# Patient Record
Sex: Female | Born: 1970 | Race: Asian | Hispanic: No | Marital: Married | Smoking: Never smoker
Health system: Southern US, Community
[De-identification: ages and names within clinical notes are randomized; demographics above are authoritative.]

## PROBLEM LIST (undated history)

## (undated) DIAGNOSIS — F32A Depression, unspecified: Secondary | ICD-10-CM

## (undated) DIAGNOSIS — I10 Essential (primary) hypertension: Secondary | ICD-10-CM

## (undated) DIAGNOSIS — F329 Major depressive disorder, single episode, unspecified: Secondary | ICD-10-CM

---

## 2018-05-24 ENCOUNTER — Emergency Department (HOSPITAL_BASED_OUTPATIENT_CLINIC_OR_DEPARTMENT_OTHER)
Admission: EM | Admit: 2018-05-24 | Discharge: 2018-05-24 | Disposition: A | Discharge status: Home / Self Care | Payer: BLUE CROSS/BLUE SHIELD | Attending: Emergency Medicine | Admitting: Emergency Medicine

## 2018-05-24 ENCOUNTER — Emergency Department (HOSPITAL_BASED_OUTPATIENT_CLINIC_OR_DEPARTMENT_OTHER): Payer: BLUE CROSS/BLUE SHIELD

## 2018-05-24 ENCOUNTER — Encounter (HOSPITAL_BASED_OUTPATIENT_CLINIC_OR_DEPARTMENT_OTHER): Payer: Self-pay | Admitting: Emergency Medicine

## 2018-05-24 ENCOUNTER — Other Ambulatory Visit: Payer: Self-pay

## 2018-05-24 DIAGNOSIS — J069 Acute upper respiratory infection, unspecified: Secondary | ICD-10-CM | POA: Insufficient documentation

## 2018-05-24 DIAGNOSIS — Z79899 Other long term (current) drug therapy: Secondary | ICD-10-CM | POA: Diagnosis not present

## 2018-05-24 DIAGNOSIS — R05 Cough: Secondary | ICD-10-CM | POA: Diagnosis present

## 2018-05-24 DIAGNOSIS — I10 Essential (primary) hypertension: Secondary | ICD-10-CM | POA: Insufficient documentation

## 2018-05-24 DIAGNOSIS — B9789 Other viral agents as the cause of diseases classified elsewhere: Secondary | ICD-10-CM

## 2018-05-24 HISTORY — DX: Essential (primary) hypertension: I10

## 2018-05-24 HISTORY — DX: Depression, unspecified: F32.A

## 2018-05-24 HISTORY — DX: Major depressive disorder, single episode, unspecified: F32.9

## 2018-05-24 MED ORDER — LIDOCAINE VISCOUS HCL 2 % MT SOLN: 15.0000 mL | OROMUCOSAL | 0 refills | Status: DC | PRN

## 2018-05-24 MED ORDER — LORATADINE 10 MG PO TABS: 10.0000 mg | ORAL_TABLET | Freq: Every day | ORAL | 0 refills | Status: AC

## 2018-05-24 MED ORDER — GUAIFENESIN-CODEINE 100-10 MG/5ML PO SOLN: 5.0000 mL | Freq: Three times a day (TID) | ORAL | 0 refills | Status: DC | PRN

## 2018-05-24 MED ORDER — BENZONATATE 100 MG PO CAPS: 200.0000 mg | ORAL_CAPSULE | Freq: Three times a day (TID) | ORAL | 0 refills | Status: AC

## 2018-05-24 NOTE — ED Notes (Signed)
Patient transported to X-ray 

## 2018-05-24 NOTE — ED Provider Notes (Addendum)
[facility] EMERGENCY DEPARTMENT Provider Note   CSN: [id number] Arrival date & time: 05/24/18  4098     History   Chief Complaint Chief Complaint  Patient presents with  . Cough    HPI Archer Vise is a 47 y.o. female with a past medical history of hypertension, who presents to ED for evaluation of 67-month history of cough productive with white mucus.  Symptoms began at the end of December.  She then traveled to [place] in January where her cough improved.  She then returned to the [place] where her cough returned.  She was seen by her PCP who completed blood work and imaging which was all negative.  She was given Ceftin ear, prednisone course and Tessalon Perles.  She states that the prednisone did help her cough but when she was finished with the course it returned.  She also reports throat irritation.  She was told that the prednisone will make her gain weight so she does not want to take it again.  Cough is worse in the morning when she wakes up.  States that there is no cough during the day.  She denies any chest pain, shortness of breath, hemoptysis, fever, sick contacts with similar symptoms, trouble swallowing, drooling, vomiting.  HPI  Past Medical History:  Diagnosis Date  . Depression   . Hypertension     There are no active problems to display for this patient.   History reviewed. No pertinent surgical history.   OB History   None      Home Medications    Prior to Admission medications   Medication Sig Start Date End Date Taking? Authorizing Provider  albuterol (PROVENTIL) (2.5 MG/3ML) 0.083% nebulizer solution USE 1 VIAL VIA NEBULIZER QID PRN FOR SOB WITH OR WITHOUT WHEEZING OR EXCESSIVE COUGH 03/08/18   [provider]  benzonatate (TESSALON) 100 MG capsule Take 2 capsules (200 mg total) by mouth every 8 (eight) hours. 05/24/18   Morris Longenecker, PA-C  guaiFENesin-codeine 100-10 MG/5ML syrup Take 5 mLs by mouth 3 (three) times daily as needed for cough.  05/24/18   Nastasha Reising, PA-C  lidocaine (XYLOCAINE) 2 % solution Use as directed 15 mLs in the mouth or throat as needed for mouth pain. 05/24/18   Nickolaus Bordelon, PA-C  lisinopril-hydrochlorothiazide (PRINZIDE,ZESTORETIC) 20-25 MG tablet TK 1 T PO D 05/01/18   [provider]  loratadine (CLARITIN) 10 MG tablet Take 1 tablet (10 mg total) by mouth daily. 05/24/18   Dietrich Pates, PA-C    Family History No family history on file.  Social History Social History   Tobacco Use  . Smoking status: Never Smoker  . Smokeless tobacco: Never Used  Substance Use Topics  . Alcohol use: Not on file  . Drug use: Not on file     Allergies   Patient has no known allergies.   Review of Systems Review of Systems  Constitutional: Negative for appetite change, chills and fever.  HENT: Negative for ear pain, rhinorrhea, sneezing and sore throat.   Eyes: Negative for photophobia and visual disturbance.  Respiratory: Positive for cough. Negative for chest tightness, shortness of breath and wheezing.   Cardiovascular: Negative for chest pain and palpitations.  Gastrointestinal: Negative for abdominal pain, blood in stool, constipation, diarrhea, nausea and vomiting.  Genitourinary: Negative for dysuria, hematuria and urgency.  Musculoskeletal: Negative for myalgias.  Skin: Negative for rash.  Neurological: Negative for dizziness, weakness and light-headedness.     Physical Exam Updated Vital Signs  BP (!) 152/86 (BP Location: Left Arm)   Pulse 77   Temp 98.1 F (36.7 C) (Oral)   Resp 16   SpO2 100%   Physical Exam  Constitutional: She appears well-developed and well-nourished. No distress.  HENT:  Head: Normocephalic and atraumatic.  Right Ear: Tympanic membrane normal.  Left Ear: Tympanic membrane normal.  Nose: Nose normal.  Mouth/Throat: Uvula is midline and oropharynx is clear and moist.  Patient does not appear to be in acute distress. No trismus or drooling present. No pooling  of secretions. Patient is tolerating secretions and is not in respiratory distress. No neck pain or tenderness to palpation of the neck. Full active and passive range of motion of the neck. No evidence of RPA or PTA.  Eyes: Pupils are equal, round, and reactive to light. Conjunctivae and EOM are normal. Right eye exhibits no discharge. Left eye exhibits no discharge. No scleral icterus.  Neck: Normal range of motion. Neck supple.  Cardiovascular: Normal rate, regular rhythm, normal heart sounds and intact distal pulses. Exam reveals no gallop and no friction rub.  No murmur heard. Pulmonary/Chest: Effort normal and breath sounds normal. No respiratory distress.  Abdominal: Soft. Bowel sounds are normal. She exhibits no distension. There is no tenderness. There is no guarding.  Musculoskeletal: Normal range of motion. She exhibits no edema.  Neurological: She is alert. She exhibits normal muscle tone. Coordination normal.  Skin: Skin is warm and dry. No rash noted.  Psychiatric: She has a normal mood and affect.  Nursing note and vitals reviewed.    ED Treatments / Results  Labs (all labs ordered are listed, but only abnormal results are displayed) Labs Reviewed - No data to display  EKG None  Radiology Dg Chest 2 View  Result Date: 05/24/2018 CLINICAL DATA:  Chronic cough EXAM: CHEST - 2 VIEW COMPARISON:  None. FINDINGS: Normal heart size. Normal mediastinal contour. No pneumothorax. No pleural effusion. Lungs appear clear, with no acute consolidative airspace disease and no pulmonary edema. IMPRESSION: No active cardiopulmonary disease. Electronically Signed   By: Delbert Phenix M.D.   On: 05/24/2018 10:34    Procedures Procedures (including critical care time)  Medications Ordered in ED Medications - No data to display   Initial Impression / Assessment and Plan / ED Course  I have reviewed the triage vital signs and the nursing notes.  Pertinent labs & imaging results that were  available during my care of the patient were reviewed by me and considered in my medical decision making (see chart for details).  Clinical Course as of May 25 1107  Sat May 24, 2018  1039 HR improved to 91 without intervention.   [HK]    Clinical Course User Index [HK] Dietrich Pates, PA-C    Patient presents to ED for evaluation of cough productive with white mucus for the past 6 months.  Symptoms began gradually at the end of December.  She has tried prednisone, Ceftin ear, Tessalon Perles with improvement in her symptoms.  States that cough is only in the morning.  She denies any chest pain, shortness of breath, hemoptysis, fever, vomiting.  On physical exam she is overall well-appearing.  She was initially tachycardic to 122 on arrival but this resolved to 80-90 without intervention when I was evaluating her.  Her lungs are clear to auscultation bilaterally.  She is not hypoxic or tachypneic.  She is afebrile.  Chest x-ray is unremarkable.  She continues to deny any chest pain or hemoptysis.  Suspect that her symptoms are viral in nature.  Will give antitussives, lidocaine to swish and spit for throat discomfort, Mucinex and advised her to follow-up with PCP for further evaluation.  Advised to return to ED for any severe worsening symptoms.  Final Clinical Impressions(s) / ED Diagnoses   Final diagnoses:  Viral URI with cough    ED Discharge Orders        Ordered    guaiFENesin-codeine 100-10 MG/5ML syrup  3 times daily PRN     05/24/18 1055    benzonatate (TESSALON) 100 MG capsule  Every 8 hours     05/24/18 1055    lidocaine (XYLOCAINE) 2 % solution  As needed     05/24/18 1055    loratadine (CLARITIN) 10 MG tablet  Daily     05/24/18 1055         Dietrich PatesKhatri, Lavada Langsam, PA-C 05/24/18 1108    Linwood DibblesKnapp, Jon, MD 05/24/18 1503

## 2018-05-24 NOTE — ED Notes (Signed)
ED Provider at bedside. 

## 2018-05-24 NOTE — Discharge Instructions (Signed)
Lidocaine is to swish and spit for throat pain. Do not swallow.

## 2018-05-24 NOTE — ED Triage Notes (Signed)
Pt reports cough since January. Seen by PCP and given tessalon without relief.

## 2018-08-06 ENCOUNTER — Ambulatory Visit: Payer: BLUE CROSS/BLUE SHIELD | Admitting: Allergy and Immunology

## 2018-08-06 ENCOUNTER — Encounter: Payer: Self-pay | Admitting: Allergy and Immunology

## 2018-08-06 VITALS — BP 130/86 | HR 100 | Temp 98.1°F | Ht 65.35 in | Wt 152.3 lb

## 2018-08-06 DIAGNOSIS — R062 Wheezing: Secondary | ICD-10-CM

## 2018-08-06 DIAGNOSIS — H1013 Acute atopic conjunctivitis, bilateral: Secondary | ICD-10-CM | POA: Diagnosis not present

## 2018-08-06 DIAGNOSIS — R05 Cough: Secondary | ICD-10-CM

## 2018-08-06 DIAGNOSIS — R053 Chronic cough: Secondary | ICD-10-CM

## 2018-08-06 DIAGNOSIS — J3089 Other allergic rhinitis: Secondary | ICD-10-CM | POA: Diagnosis not present

## 2018-08-06 DIAGNOSIS — H1010 Acute atopic conjunctivitis, unspecified eye: Secondary | ICD-10-CM | POA: Insufficient documentation

## 2018-08-06 DIAGNOSIS — R059 Cough, unspecified: Secondary | ICD-10-CM | POA: Insufficient documentation

## 2018-08-06 MED ORDER — CARBINOXAMINE MALEATE 4 MG PO TABS: ORAL_TABLET | ORAL | 5 refills | Status: AC

## 2018-08-06 MED ORDER — AZELASTINE HCL 0.15 % NA SOLN: NASAL | 5 refills | Status: AC

## 2018-08-06 MED ORDER — OLOPATADINE HCL 0.7 % OP SOLN: 1.0000 [drp] | OPHTHALMIC | 5 refills | Status: AC

## 2018-08-06 NOTE — Assessment & Plan Note (Signed)
The patient's history and physical examination suggest upper airway cough syndrome.  Spirometry today reveals normal ventilatory function. We will aggressively treat postnasal drainage and evaluate results.  Treatment plan as outlined below.  If the coughing persists or progresses despite this plan, we will evaluate further. 

## 2018-08-06 NOTE — Assessment & Plan Note (Addendum)
   Aeroallergen avoidance measures have been discussed and provided in written form.  A prescription has been provided for carbinoxamine 4 mg every 8 hours if needed.  A prescription has been provided for azelastine nasal spray, 2 sprays per nostril 2 times daily as needed. Proper nasal spray technique has been discussed and demonstrated.   Nasal saline lavage (NeilMed) has been recommended as needed and prior to medicated nasal sprays along with instructions for proper administration.  If allergen avoidance measures and medications fail to adequately relieve symptoms, aeroallergen immunotherapy will be considered.

## 2018-08-06 NOTE — Patient Instructions (Addendum)
Cough, persistent The patient's history and physical examination suggest upper airway cough syndrome.  Spirometry today reveals normal ventilatory function. We will aggressively treat postnasal drainage and evaluate results.  Treatment plan as outlined below.  If the coughing persists or progresses despite this plan, we will evaluate further.  Perennial and seasonal allergic rhinitis  Aeroallergen avoidance measures have been discussed and provided in written form.  A prescription has been provided for carbinoxamine 4 mg every 8 hours if needed.  A prescription has been provided for azelastine nasal spray, 2 sprays per nostril 2 times daily as needed. Proper nasal spray technique has been discussed and demonstrated.   Nasal saline lavage (NeilMed) has been recommended as needed and prior to medicated nasal sprays along with instructions for proper administration.  If allergen avoidance measures and medications fail to adequately relieve symptoms, aeroallergen immunotherapy will be considered.  Allergic conjunctivitis  Treatment plan as outlined above for allergic rhinitis.  A prescription has been provided for Pazeo, one drop per eye daily as needed.   Return in about 3 months (around 11/06/2018), or if symptoms worsen or fail to improve.  Reducing Pollen Exposure  The American Academy of Allergy, Asthma and Immunology suggests the following steps to reduce your exposure to pollen during allergy seasons.    1. Do not hang sheets or clothing out to dry; pollen may collect on these items. 2. Do not mow lawns or spend time around freshly cut grass; mowing stirs up pollen. 3. Keep windows closed at night.  Keep car windows closed while driving. 4. Minimize morning activities outdoors, a time when pollen counts are usually at their highest. 5. Stay indoors as much as possible when pollen counts or humidity is high and on windy days when pollen tends to remain in the air longer. 6. Use  air conditioning when possible.  Many air conditioners have filters that trap the pollen spores. 7. Use a HEPA room air filter to remove pollen form the indoor air you breathe.   Control of House Dust Mite Allergen  House dust mites play a major role in allergic asthma and rhinitis.  They occur in environments with high humidity wherever human skin, the food for dust mites is found. High levels have been detected in dust obtained from mattresses, pillows, carpets, upholstered furniture, bed covers, clothes and soft toys.  The principal allergen of the house dust mite is found in its feces.  A gram of dust may contain 1,000 mites and 250,000 fecal particles.  Mite antigen is easily measured in the air during house cleaning activities.    1. Encase mattresses, including the box spring, and pillow, in an air tight cover.  Seal the zipper end of the encased mattresses with wide adhesive tape. 2. Wash the bedding in water of 130 degrees Farenheit weekly.  Avoid cotton comforters/quilts and flannel bedding: the most ideal bed covering is the dacron comforter. 3. Remove all upholstered furniture from the bedroom. 4. Remove carpets, carpet padding, rugs, and non-washable window drapes from the bedroom.  Wash drapes weekly or use plastic window coverings. 5. Remove all non-washable stuffed toys from the bedroom.  Wash stuffed toys weekly. 6. Have the room cleaned frequently with a vacuum cleaner and a damp dust-mop.  The patient should not be in a room which is being cleaned and should wait 1 hour after cleaning before going into the room. 7. Close and seal all heating outlets in the bedroom.  Otherwise, the room will become filled  with dust-laden air.  An electric heater can be used to heat the room. Reduce indoor humidity to less than 50%.  Do not use a humidifier.   Control of Mold Allergen  Mold and fungi can grow on a variety of surfaces provided certain temperature and moisture conditions exist.   Outdoor molds grow on plants, decaying vegetation and soil.  The major outdoor mold, Alternaria and Cladosporium, are found in very high numbers during hot and dry conditions.  Generally, a late Summer - Fall peak is seen for common outdoor fungal spores.  Rain will temporarily lower outdoor mold spore count, but counts rise rapidly when the rainy period ends.  The most important indoor molds are Aspergillus and Penicillium.  Dark, humid and poorly ventilated basements are ideal sites for mold growth.  The next most common sites of mold growth are the bathroom and the kitchen.  Outdoor MicrosoftMold Control 1. Use air conditioning and keep windows closed 2. Avoid exposure to decaying vegetation. 3. Avoid leaf raking. 4. Avoid grain handling. 5. Consider wearing a face mask if working in moldy areas.  Indoor Mold Control 1. Maintain humidity below 50%. 2. Clean washable surfaces with 5% bleach solution. 3. Remove sources e.g. Contaminated carpets.  Control of Cockroach Allergen  Cockroach allergen has been identified as an important cause of acute attacks of asthma, especially in urban settings.  There are fifty-five species of cockroach that exist in the [place], however only three, the [place], [place] and Oriental species produce allergen that can affect patients with Asthma.  Allergens can be obtained from fecal particles, egg casings and secretions from cockroaches.    1. Remove food sources. 2. Reduce access to water. 3. Seal access and entry points. 4. Spray runways with 0.5-1% Diazinon or Chlorpyrifos 5. Blow boric acid power under stoves and refrigerator. 6. Place bait stations (hydramethylnon) at feeding sites.

## 2018-08-06 NOTE — Progress Notes (Signed)
New Patient Note  RE: Connie Horne MRN: [id number] DOB: 11-19-1971 Date of Office Visit: 08/06/2018  Referring provider: No ref. provider found Primary care provider: System, Pcp Not In  Chief Complaint: Cough   History of present illness: Connie Horne is a 47 y.o. female presenting today for evaluation of a persistent cough. She is accompanied today by an interpreter who assists with the history.  Over the past 7 months she has experienced the sensation of mucus in the throat, throat irritation, and coughing.  The cough improved somewhat over the past 2 months after having started an intranasal steroid spray.  She denies heartburn and water brash.  She denies dyspnea and chest tightness.  She has heard wheezing, however believes that the wheeze originates in the throat when she has mucus in the throat.  She experiences occasional nasal pruritus and ocular pruritus.  No significant seasonal symptom variation has been noted nor have specific environmental triggers been identified.  Assessment and plan: Cough, persistent The patient's history and physical examination suggest upper airway cough syndrome.  Spirometry today reveals normal ventilatory function. We will aggressively treat postnasal drainage and evaluate results.  Treatment plan as outlined below.  If the coughing persists or progresses despite this plan, we will evaluate further.  Perennial and seasonal allergic rhinitis  Aeroallergen avoidance measures have been discussed and provided in written form.  A prescription has been provided for carbinoxamine 4 mg every 8 hours if needed.  A prescription has been provided for azelastine nasal spray, 2 sprays per nostril 2 times daily as needed. Proper nasal spray technique has been discussed and demonstrated.   Nasal saline lavage (NeilMed) has been recommended as needed and prior to medicated nasal sprays along with instructions for proper administration.  If allergen avoidance  measures and medications fail to adequately relieve symptoms, aeroallergen immunotherapy will be considered.  Allergic conjunctivitis  Treatment plan as outlined above for allergic rhinitis.  A prescription has been provided for Pazeo, one drop per eye daily as needed.   Meds ordered this encounter  Medications  . Carbinoxamine Maleate 4 MG TABS    Sig: Every 8 hours if needed    Dispense:  30 each    Refill:  5  . Azelastine HCl 0.15 % SOLN    Sig: 2 sprays per nostril 2 times daily as needed    Dispense:  30 mL    Refill:  5  . Olopatadine HCl (PAZEO) 0.7 % SOLN    Sig: Place 1 drop into both eyes 1 day or 1 dose.    Dispense:  1 Bottle    Refill:  5    Diagnostics: Spirometry: FVC was 3.52 L and FEV1 was 2.57 L (90% predicted with an FEV1 ratio of 91%.  There was not significant postbronchodilator improvement.  Please see scanned spirometry results for details. Environmental skin testing: Positive to grass pollen, weed pollen, ragweed pollen, tree pollen, molds, cockroach antigen, and dust mite antigen.    Physical examination: Blood pressure 130/86, pulse 100, temperature 98.1 F (36.7 C), temperature source Oral, height 5' 5.35" (1.66 m), weight 152 lb 5.4 oz (69.1 kg).  General: Alert, interactive, in no acute distress. HEENT: TMs pearly gray, turbinates moderately edematous with thick discharge, post-pharynx erythematous. Neck: Supple without lymphadenopathy. Lungs: Clear to auscultation without wheezing, rhonchi or rales. CV: Normal S1, S2 without murmurs. Abdomen: Nondistended, nontender. Skin: Warm and dry, without lesions or rashes. Extremities:  No clubbing, cyanosis or edema. Neuro:  Grossly intact.  Review of systems:  Review of systems negative except as noted in HPI / PMHx or noted below: Review of Systems  Constitutional: Negative.   HENT: Negative.   Eyes: Negative.   Respiratory: Negative.   Cardiovascular: Negative.   Gastrointestinal:  Negative.   Genitourinary: Negative.   Musculoskeletal: Negative.   Skin: Negative.   Neurological: Negative.   Endo/Heme/Allergies: Negative.   Psychiatric/Behavioral: Negative.     Past medical history:  Past Medical History:  Diagnosis Date  . Depression   . Hypertension     Past surgical history:  History reviewed. No pertinent surgical history.  Family history: History reviewed. No pertinent family history.  Social history: Social History   Socioeconomic History  . Marital status: Married    Spouse name: Not on file  . Number of children: Not on file  . Years of education: Not on file  . Highest education level: Not on file  Occupational History  . Not on file  Social Needs  . Financial resource strain: Not on file  . Food insecurity:    Worry: Not on file    Inability: Not on file  . Transportation needs:    Medical: Not on file    Non-medical: Not on file  Tobacco Use  . Smoking status: Never Smoker  . Smokeless tobacco: Never Used  Substance and Sexual Activity  . Alcohol use: Never    Frequency: Never  . Drug use: Never  . Sexual activity: Not on file  Lifestyle  . Physical activity:    Days per week: Not on file    Minutes per session: Not on file  . Stress: Not on file  Relationships  . Social connections:    Talks on phone: Not on file    Gets together: Not on file    Attends religious service: Not on file    Active member of club or organization: Not on file    Attends meetings of clubs or organizations: Not on file    Relationship status: Not on file  . Intimate partner violence:    Fear of current or ex partner: Not on file    Emotionally abused: Not on file    Physically abused: Not on file    Forced sexual activity: Not on file  Other Topics Concern  . Not on file  Social History Narrative  . Not on file   Environmental History: The patient lives in a 49 year old house with hardwood floors throughout, electric heat, and window  air conditioning units.  There is no known mold/water damage in the home.  She is a non-smoker without pets.  Allergies as of 08/06/2018   No Known Allergies     Medication List        Accurate as of 08/06/18 11:55 AM. Always use your most recent med list.          Azelastine HCl 0.15 % Soln 2 sprays per nostril 2 times daily as needed   benzonatate 100 MG capsule Commonly known as:  TESSALON Take 2 capsules (200 mg total) by mouth every 8 (eight) hours.   Carbinoxamine Maleate 4 MG Tabs Every 8 hours if needed   EQ BUDESONIDE NASAL NA Place 1 spray into the nose daily.   fenofibrate 145 MG tablet Commonly known as:  TRICOR TK 1 T PO D   fluticasone 27.5 MCG/SPRAY nasal spray Commonly known as:  VERAMYST Place 1 spray into the nose daily.   lisinopril-hydrochlorothiazide 20-25 MG  tablet Commonly known as:  PRINZIDE,ZESTORETIC TK 1 T PO D   loratadine 10 MG tablet Commonly known as:  CLARITIN Take 1 tablet (10 mg total) by mouth daily.   Olopatadine HCl 0.7 % Soln Place 1 drop into both eyes 1 day or 1 dose.       Known medication allergies: No Known Allergies  I appreciate the opportunity to take part in Madeleine's care. Please do not hesitate to contact me with questions.  Sincerely,   R. Jorene Guestarter Abdulla Pooley, MD

## 2018-08-06 NOTE — Assessment & Plan Note (Signed)
   Treatment plan as outlined above for allergic rhinitis.  A prescription has been provided for Pazeo, one drop per eye daily as needed. 

## 2018-10-27 IMAGING — CR DG CHEST 2V
2 series · 2 of 2 positions shown · non-contrast
Comparison: None.

CLINICAL DATA: Chronic cough

EXAM:
CHEST - 2 VIEW

[w chest pa]
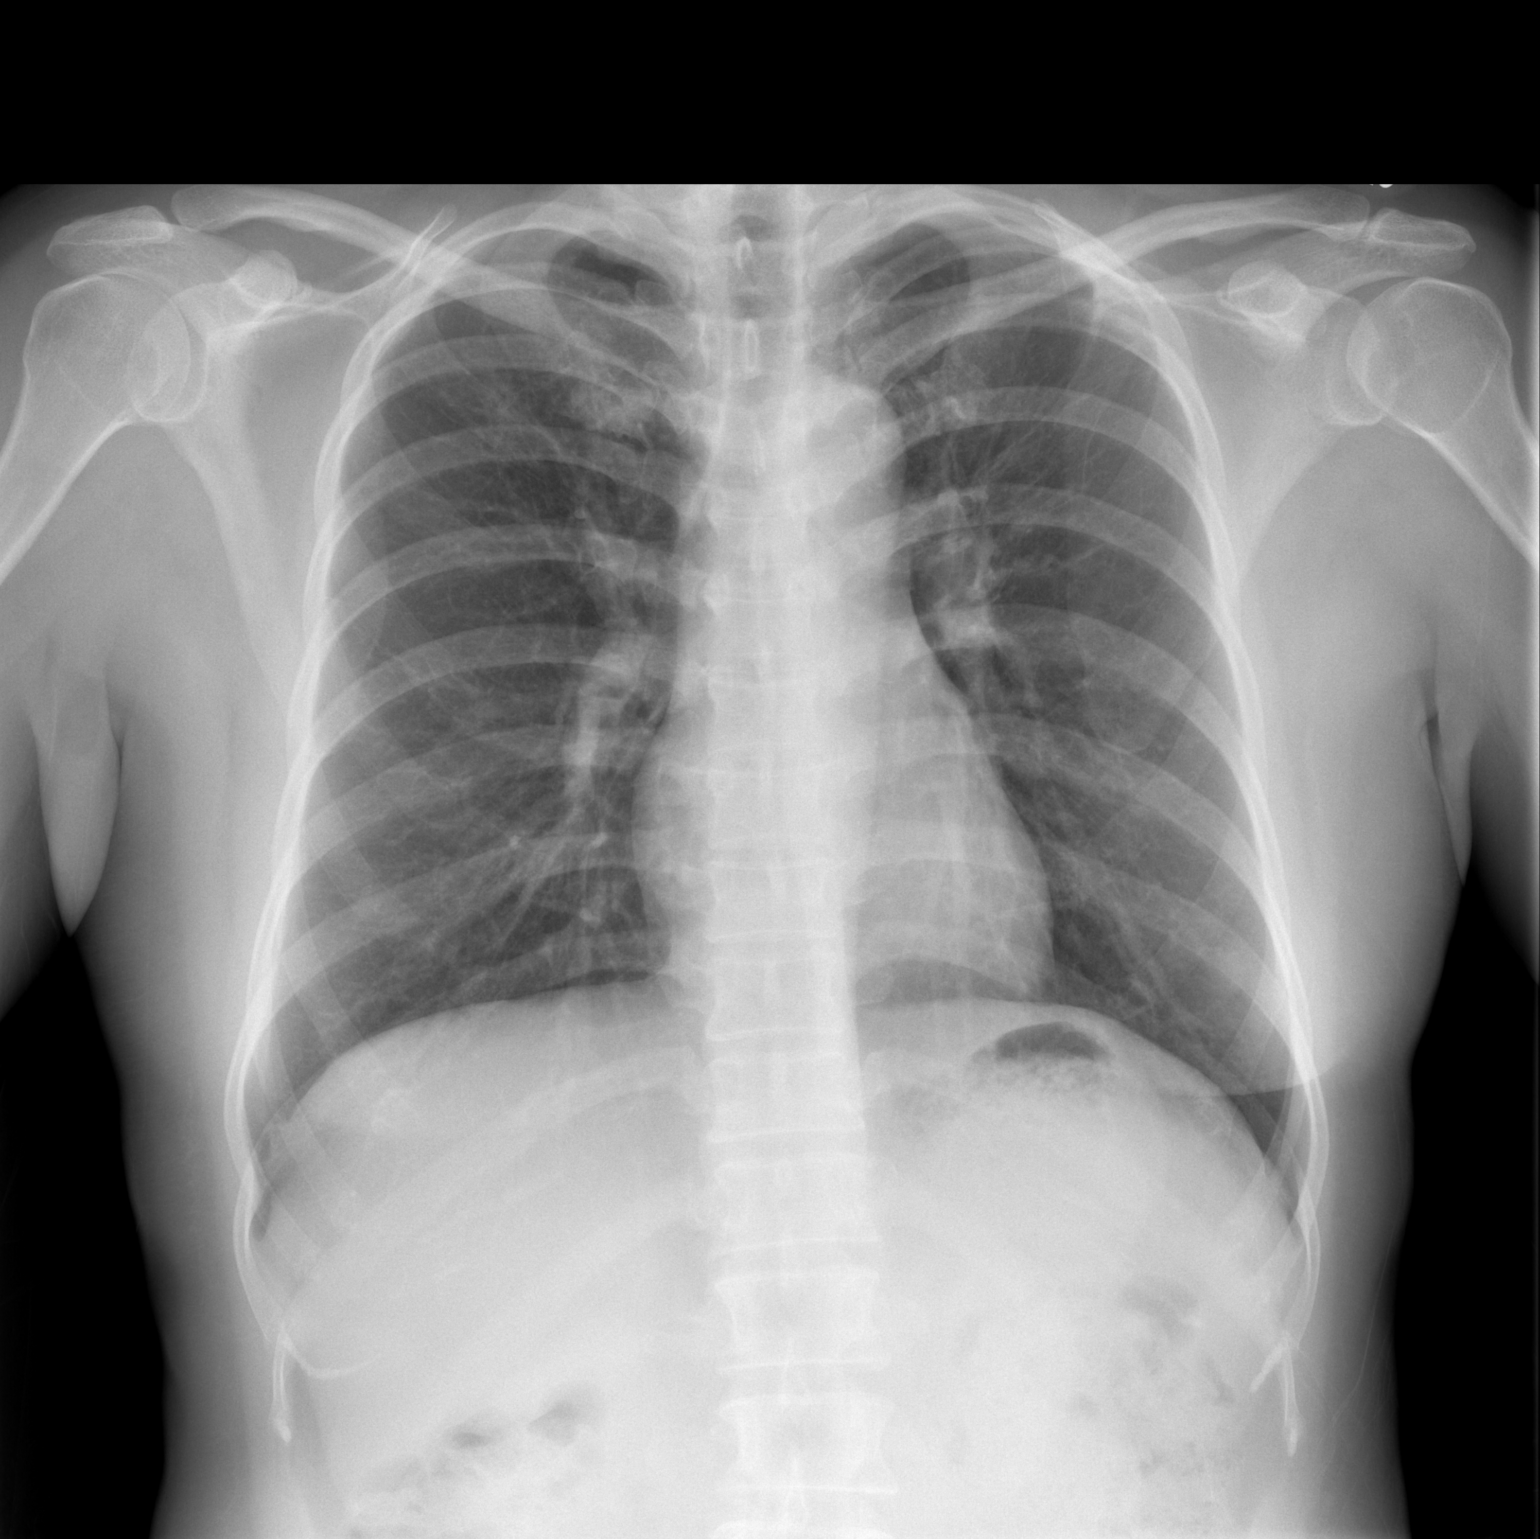

[w chest lat]
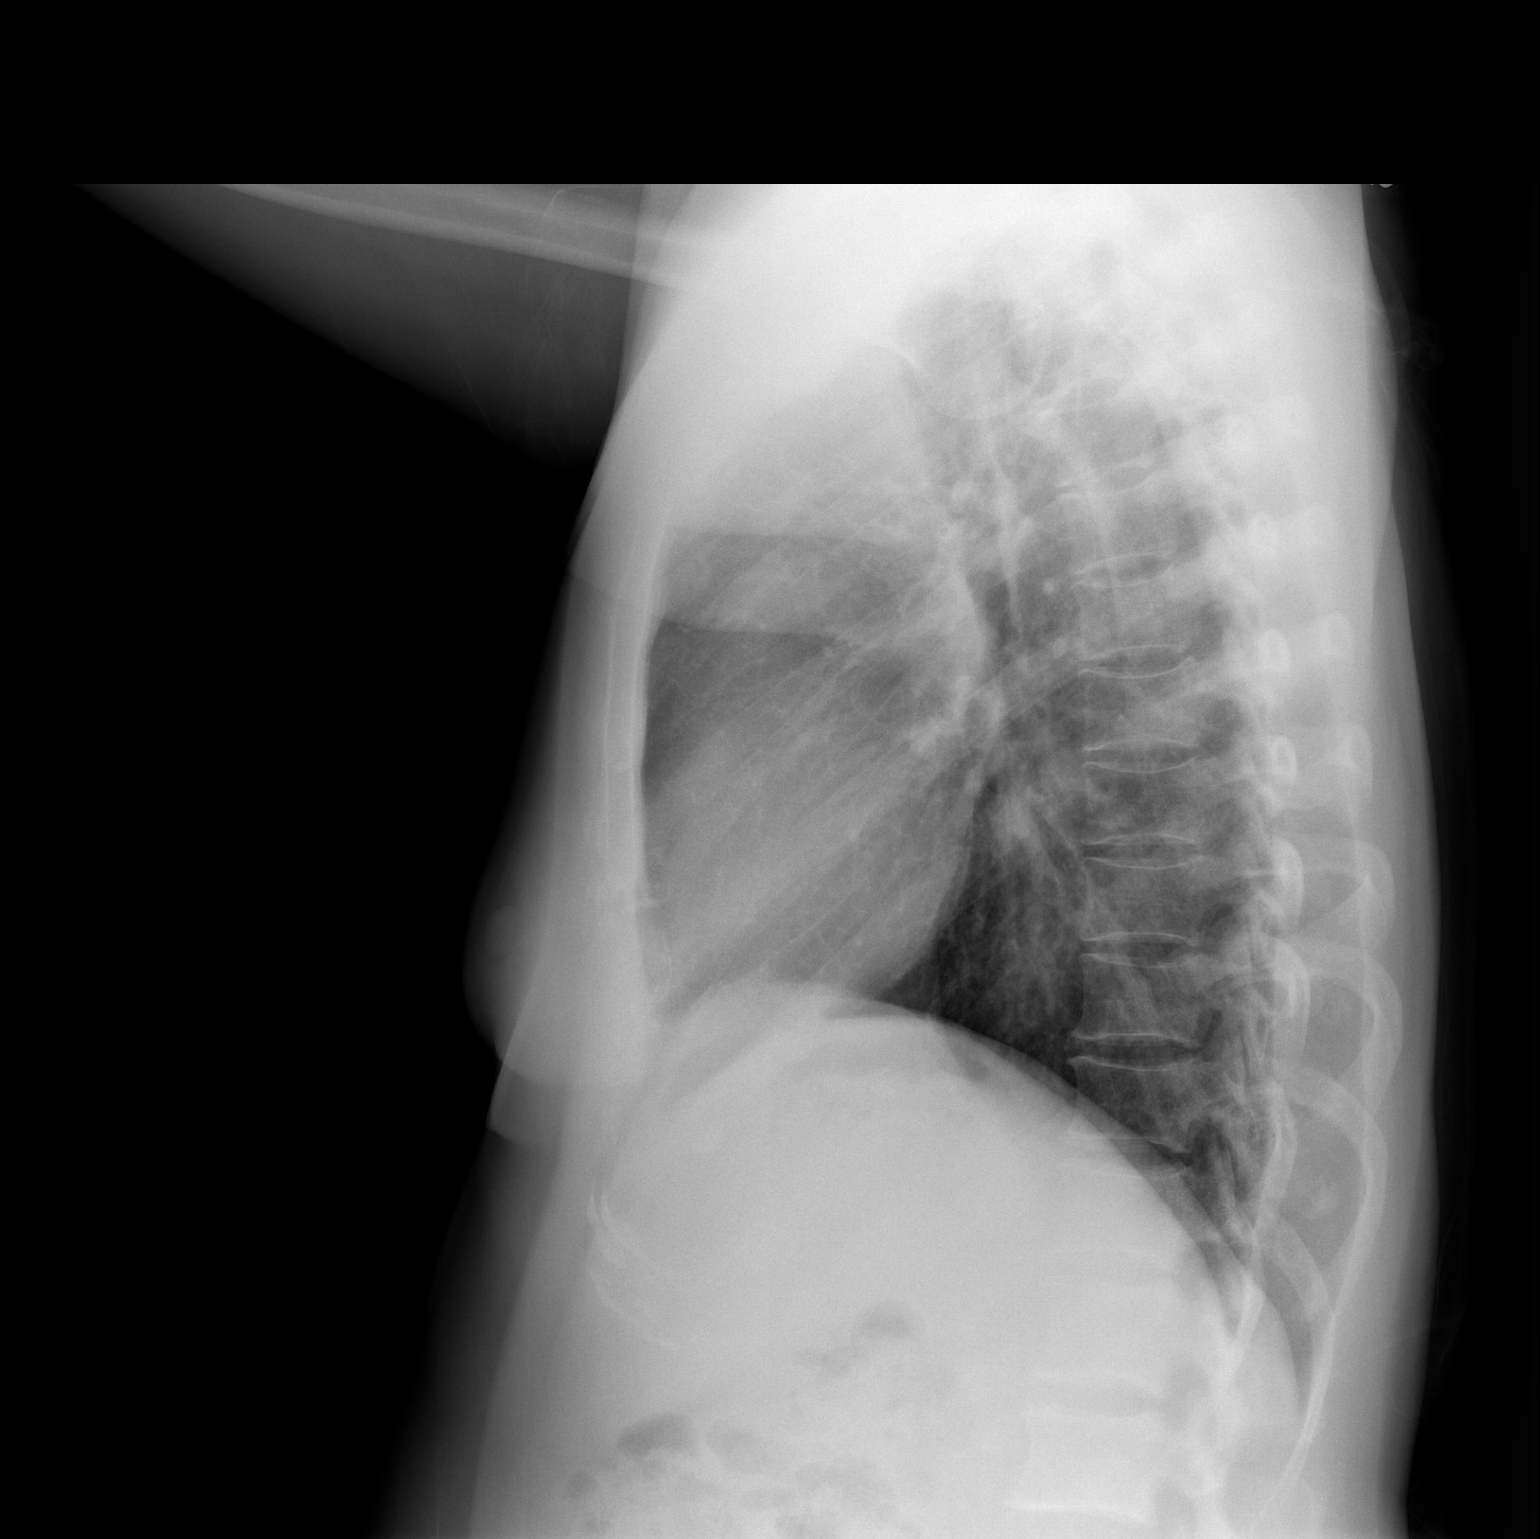

[2 of 2 positions shown; findings below may reference images not displayed]

FINDINGS: Normal heart size. Normal mediastinal contour. No pneumothorax. No
pleural effusion. Lungs appear clear, with no acute consolidative
airspace disease and no pulmonary edema.
IMPRESSION: No active cardiopulmonary disease.

## 2018-11-12 ENCOUNTER — Ambulatory Visit: Payer: BLUE CROSS/BLUE SHIELD | Admitting: Allergy and Immunology

## 2019-02-02 ENCOUNTER — Ambulatory Visit: Payer: BLUE CROSS/BLUE SHIELD | Admitting: Family Medicine

## 2019-02-02 ENCOUNTER — Encounter: Payer: Self-pay | Admitting: Family Medicine

## 2019-02-02 VITALS — BP 120/78 | HR 82 | Temp 98.3°F | Resp 18 | Ht 65.2 in | Wt 152.0 lb

## 2019-02-02 DIAGNOSIS — J3089 Other allergic rhinitis: Secondary | ICD-10-CM | POA: Diagnosis not present

## 2019-02-02 DIAGNOSIS — R059 Cough, unspecified: Secondary | ICD-10-CM

## 2019-02-02 DIAGNOSIS — H1013 Acute atopic conjunctivitis, bilateral: Secondary | ICD-10-CM | POA: Diagnosis not present

## 2019-02-02 DIAGNOSIS — K219 Gastro-esophageal reflux disease without esophagitis: Secondary | ICD-10-CM | POA: Diagnosis not present

## 2019-02-02 DIAGNOSIS — J4520 Mild intermittent asthma, uncomplicated: Secondary | ICD-10-CM | POA: Diagnosis not present

## 2019-02-02 DIAGNOSIS — J45909 Unspecified asthma, uncomplicated: Secondary | ICD-10-CM | POA: Insufficient documentation

## 2019-02-02 DIAGNOSIS — R05 Cough: Secondary | ICD-10-CM | POA: Diagnosis not present

## 2019-02-02 MED ORDER — FLUTICASONE PROPIONATE 50 MCG/ACT NA SUSP: NASAL | 5 refills | Status: AC

## 2019-02-02 MED ORDER — MONTELUKAST SODIUM 10 MG PO TABS: ORAL_TABLET | ORAL | 5 refills | Status: DC

## 2019-02-02 NOTE — Patient Instructions (Addendum)
Allergic rhinitis Stop carbinoxamine. Begin cetirizine 10 mg once a day Begin montelukast 10 mg once a day at bedtime Begn Flonase one spray in each nostril once a day Begin Mucinex [phone] mg once a day and increase fluid intake Begin saline nasal rinses. Use this before nasal sprays Continue avoidance of grass pollen, weed pollen, molds, cockroach, and dust mite  Allergic conjunctivitis Continue Pazeo eye drops one drop in each eye once a day as needed  Reflux Begin famotidine 20 mg twice a day to decrease reflux. This may be the cause of your cough  Follow up in 2 months or sooner if needed

## 2019-02-02 NOTE — Progress Notes (Signed)
[address] [place] [address] Dept: [phone]  FOLLOW UP NOTE  Patient ID: Connie Horne, female    DOB: 01/06/1971  Age: 48 y.o. MRN: [id number] Date of Office Visit: 02/02/2019  Assessment  Chief Complaint: Allergies ( cough alot at night, chest xray last sunday because of cough pcp ordered it)  HPI Connie Horne is a 48 year old female who presents to the clinic for a follow up visit. An video interpretor is available during the visit. She reports an increased cough that occurs mostly during the night. She reports her asthma has been well controlled with no shortness of breath or wheeze with activity or rest. She reports allergic rhinitis is not well controlled with thick post nasal drainage, cough and frequent throat clearing. She is currently using Nasonex, azelastine, and carbinoxamine 4 mg once a day with no relief from symptoms. She is not using nasal rinses nor dies she have dust mite free covers on her mattress or pillow. She denies reflux and is not taking any medications for reflux. Her current medications are listed in the chart.   Drug Allergies:  No Known Allergies  Physical Exam: BP 120/78   Pulse 82   Temp 98.3 F (36.8 C) (Oral)   Resp 18   Ht 5' 5.2" (1.656 m)   Wt 152 lb (68.9 kg)   SpO2 100%   BMI 25.14 kg/m    Physical Exam Vitals signs reviewed.  Constitutional:      Appearance: Normal appearance.  HENT:     Head: Normocephalic and atraumatic.     Nose:     Comments: Bilateral nares edematous and pale with no nasal drainage noted. Pharynx slightly erythematous with no exudate noted. Ears normal. Eyes normal. Eyes:     Conjunctiva/sclera: Conjunctivae normal.  Neck:     Musculoskeletal: Normal range of motion and neck supple.  Cardiovascular:     Rate and Rhythm: Normal rate and regular rhythm.     Heart sounds: Normal heart sounds. No murmur.  Pulmonary:     Effort: Pulmonary effort is normal.     Breath sounds: Normal breath sounds.   Comments: Lungs clear to auscultation Musculoskeletal: Normal range of motion.  Skin:    General: Skin is warm and dry.  Neurological:     Mental Status: She is alert and oriented to person, place, and time.  Psychiatric:        Mood and Affect: Mood normal.        Behavior: Behavior normal.        Thought Content: Thought content normal.        Judgment: Judgment normal.     Diagnostics:  FVC 3.36, FEV1 2.46. Predicted FVC 3.51, predicted FEV1 2.84. Spirometry is within the normal range.    Assessment and Plan: 1. Other allergic rhinitis   2. Allergic conjunctivitis of both eyes   3. Gastroesophageal reflux disease, esophagitis presence not specified   4. Cough   5. Mild intermittent reactive airway disease without complication     Meds ordered this encounter  Medications  . montelukast (SINGULAIR) 10 MG tablet    Sig: 10 mg once a day at bedtime    Dispense:  30 tablet    Refill:  5  . fluticasone (FLONASE) 50 MCG/ACT nasal spray    Sig: 1 spray in each nostril once a day    Dispense:  30 g    Refill:  5    Patient Instructions  Allergic rhinitis Stop  carbinoxamine. Begin cetirizine 10 mg once a day Begin montelukast 10 mg once a day at bedtime Begn Flonase one spray in each nostril once a day Begin Mucinex [phone] mg once a day and increase fluid intake Begin saline nasal rinses. Use this before nasal sprays Continue avoidance of grass pollen, weed pollen, molds, cockroach, and dust mite  Allergic conjunctivitis Continue Pazeo eye drops one drop in each eye once a day as needed  Reflux Begin famotidine 20 mg twice a day to decrease reflux. This may be the cause of your cough  Follow up in 2 months or sooner if needed   Return in about 2 months (around 04/03/2019), or if symptoms worsen or fail to improve.   Thank you for the opportunity to care for this patient.  Please do not hesitate to contact me with questions.  Thermon Leyland, FNP Allergy and Asthma  [facility]  I have provided oversight concerning Thermon Leyland' evaluation and treatment of this patient's health issues addressed during today's encounter. I agree with the assessment and therapeutic plan as outlined in the note.   Thank you for the opportunity to care for this patient.  Please do not hesitate to contact me with questions.  Tonette Bihari, M.D.  [facility] [address] [place], [place] [address] [phone]

## 2019-02-15 ENCOUNTER — Other Ambulatory Visit: Payer: Self-pay | Admitting: Allergy and Immunology

## 2019-07-12 ENCOUNTER — Other Ambulatory Visit: Payer: Self-pay | Admitting: Family Medicine

## 2019-08-10 ENCOUNTER — Other Ambulatory Visit: Payer: Self-pay | Admitting: Family Medicine

## 2019-09-08 ENCOUNTER — Other Ambulatory Visit: Payer: Self-pay | Admitting: Family Medicine

## 2019-09-30 ENCOUNTER — Other Ambulatory Visit: Payer: Self-pay | Admitting: Family Medicine

## 2019-10-12 ENCOUNTER — Other Ambulatory Visit: Payer: Self-pay | Admitting: Family Medicine

## 2019-10-12 MED ORDER — MONTELUKAST SODIUM 10 MG PO TABS: 10.0000 mg | ORAL_TABLET | Freq: Every day | ORAL | 0 refills | Status: DC

## 2019-10-12 NOTE — Telephone Encounter (Signed)
Pt needs ov for refills

## 2019-10-12 NOTE — Telephone Encounter (Signed)
Scheduled a OV. Sent 1 courtesy refill.

## 2019-10-12 NOTE — Telephone Encounter (Signed)
Refill needed for montelukast sent to same pharmacy. [facility] on [address] in [place]

## 2019-10-20 ENCOUNTER — Encounter: Payer: Self-pay | Admitting: Family Medicine

## 2019-10-20 ENCOUNTER — Other Ambulatory Visit: Payer: Self-pay

## 2019-10-20 ENCOUNTER — Ambulatory Visit (INDEPENDENT_AMBULATORY_CARE_PROVIDER_SITE_OTHER): Payer: BC Managed Care – PPO | Admitting: Family Medicine

## 2019-10-20 VITALS — BP 116/68 | HR 104 | Temp 97.9°F | Resp 18 | Ht 65.7 in | Wt 152.0 lb

## 2019-10-20 DIAGNOSIS — J3089 Other allergic rhinitis: Secondary | ICD-10-CM | POA: Diagnosis not present

## 2019-10-20 DIAGNOSIS — J4541 Moderate persistent asthma with (acute) exacerbation: Secondary | ICD-10-CM | POA: Diagnosis not present

## 2019-10-20 DIAGNOSIS — K219 Gastro-esophageal reflux disease without esophagitis: Secondary | ICD-10-CM | POA: Diagnosis not present

## 2019-10-20 DIAGNOSIS — H1013 Acute atopic conjunctivitis, bilateral: Secondary | ICD-10-CM

## 2019-10-20 DIAGNOSIS — J302 Other seasonal allergic rhinitis: Secondary | ICD-10-CM | POA: Insufficient documentation

## 2019-10-20 MED ORDER — PAZEO 0.7 % OP SOLN: 1.0000 [drp] | Freq: Every day | OPHTHALMIC | 5 refills | Status: AC | PRN

## 2019-10-20 MED ORDER — FAMOTIDINE 20 MG PO TABS: 20.0000 mg | ORAL_TABLET | Freq: Two times a day (BID) | ORAL | 5 refills | Status: AC

## 2019-10-20 MED ORDER — ALBUTEROL SULFATE HFA 108 (90 BASE) MCG/ACT IN AERS: 2.0000 | INHALATION_SPRAY | RESPIRATORY_TRACT | 1 refills | Status: AC | PRN

## 2019-10-20 MED ORDER — CETIRIZINE HCL 10 MG PO TABS: 10.0000 mg | ORAL_TABLET | Freq: Every day | ORAL | 5 refills | Status: AC

## 2019-10-20 MED ORDER — BUDESONIDE 32 MCG/ACT NA SUSP: 1.0000 | Freq: Every day | NASAL | 5 refills | Status: AC

## 2019-10-20 NOTE — Progress Notes (Signed)
[place] [address] Dept: [phone]  FOLLOW UP NOTE  Patient ID: Connie Horne, female    DOB: [birth year]  Age: 48 y.o. MRN: [id number] Date of Office Visit: 10/20/2019  Assessment  Chief Complaint: Allergies  HPI Connie Horne is a 48 year old female who presents to the clinic for a follow up visit. She is accompanied by an interpereter. She reports her asthma has been well controlled with no shortness of breath or wheeze with activity or rest. She reports allergic rhinitis as not well controlled with symptoms including cough producing clear to white mucus, thick post nasal drainage, and frequent throat clearing. She is using Rhinocort nasal spray daily with significant improvement of her allergic rhinitis symptoms. She reports that she took either cetirizine or montelukast 10 mg with relief of symptoms. Asthma is reported as well controlled with no shortness of breath or wheeze with activity or rest. She reports frequent cough when lying down that is producing mucus. She reports reflux has been well controlled with no medical intervention. She reports Pazeo relieves her symptoms of allergic conjunctivitis. Her current medications are listed in the chart.  Drug Allergies:  No Known Allergies  Physical Exam: BP 116/68   Pulse (!) 104   Temp 97.9 F (36.6 C) (Temporal)   Resp 18   Ht 5' 5.7" (1.669 m)   Wt 152 lb (68.9 kg)   SpO2 98%   BMI 24.76 kg/m    Physical Exam Vitals signs reviewed.  Constitutional:      Appearance: Normal appearance.  HENT:     Head: Normocephalic and atraumatic.     Right Ear: Tympanic membrane normal.     Left Ear: Tympanic membrane normal.     Nose:     Comments: Bilateral nares slightly erythematous with clear nasal drainage noted. pharynx slightly erythematous with no exudate noted. Ears normal. Eyes normal. Eyes:     Conjunctiva/sclera: Conjunctivae normal.  Neck:     Musculoskeletal: Normal range of motion and neck supple.   Cardiovascular:     Rate and Rhythm: Normal rate and regular rhythm.     Heart sounds: Normal heart sounds. No murmur.  Pulmonary:     Effort: Pulmonary effort is normal.     Breath sounds: Normal breath sounds.     Comments: Lungs clear to auscultation Musculoskeletal: Normal range of motion.  Skin:    General: Skin is warm and dry.  Neurological:     Mental Status: She is alert and oriented to person, place, and time.  Psychiatric:        Mood and Affect: Mood normal.        Behavior: Behavior normal.        Thought Content: Thought content normal.        Judgment: Judgment normal.     Diagnostics: FVC 3.24, FEV1 1.98. Predicted FVC 3.41, predicted FEV1 2.75. Spirometry indicates mild airways obstruction. Post bronchodilator therapy FVC 3.30, FEV1 2.75. Post bronchodilator therapy indicates normal ventilatory function with a 39% increase in FEV1.    Assessment and Plan: 1. Moderate persistent reactive airway disease with acute exacerbation   2. Seasonal and perennial allergic rhinitis   3. Allergic conjunctivitis of both eyes   4. Gastroesophageal reflux disease, unspecified whether esophagitis present     Meds ordered this encounter  Medications  . cetirizine (ZYRTEC) 10 MG tablet    Sig: Take 1 tablet (10 mg total) by mouth daily.    Dispense:  30 tablet  Refill:  5  . famotidine (PEPCID) 20 MG tablet    Sig: Take 1 tablet (20 mg total) by mouth 2 (two) times daily.    Dispense:  60 tablet    Refill:  5  . budesonide (RHINOCORT AQUA) 32 MCG/ACT nasal spray    Sig: Place 1 spray into both nostrils daily.    Dispense:  8.43 mL    Refill:  5  . Olopatadine HCl (PAZEO) 0.7 % SOLN    Sig: Place 1 drop into both eyes daily as needed.    Dispense:  2.5 mL    Refill:  5  . albuterol (PROVENTIL HFA) 108 (90 Base) MCG/ACT inhaler    Sig: Inhale 2 puffs into the lungs every 4 (four) hours as needed for wheezing or shortness of breath.    Dispense:  8 g    Refill:  1     Patient Instructions  Allergic rhinitis Stop cetirizine for 1 week. Then begin cetirizine 10 mg once a day as needed for a runny nose Continue montelukast 10 mg once a day at bedtime Continue Rhinocort 1 spray in each nostril once a day Begin Mucinex [phone] mg once a day and increase fluid intake to thin mucus secretions Begin saline nasal rinses. Use this before nasal sprays Continue avoidance of grass pollen, weed pollen, molds, cockroach, and dust mite avoidance measures listed below  Asthma Begin albuterol 2 puffs every 4 hous as needed for a cough or wheeze For now begin Flovent 110-2 puffs twice a day with a spacer prevent cough or wheeze Continue montelukast 10 mg once a day as above to prevent cough or wheeze  Allergic conjunctivitis Continue Pazeo eye drops one drop in each eye once a day as needed  Reflux Begin famotidine 20 mg twice a day to decrease reflux. This may be the cause of your cough  Follow up in 2 months or sooner if needed   Return in about 2 months (around 12/20/2019), or if symptoms worsen or fail to improve.   Thank you for the opportunity to care for this patient.  Please do not hesitate to contact me with questions.  Thermon Leyland, FNP [facility]  I have provided oversight concerning Thermon Leyland' evaluation and treatment of this patient's health issues addressed during today's encounter. I agree with the assessment and therapeutic plan as outlined in the note.   Thank you for the opportunity to care for this patient.  Please do not hesitate to contact me with questions.  Tonette Bihari, M.D.  [facility] [address] [place], [place] [address] [phone]

## 2019-10-20 NOTE — Patient Instructions (Addendum)
Allergic rhinitis Stop cetirizine for 1 week. Then begin cetirizine 10 mg once a day as needed for a runny nose Continue montelukast 10 mg once a day at bedtime Continue Rhinocort 1 spray in each nostril once a day Begin Mucinex [phone] mg once a day and increase fluid intake to thin mucus secretions Begin saline nasal rinses. Use this before nasal sprays Continue avoidance of grass pollen, weed pollen, molds, cockroach, and dust mite avoidance measures listed below  Asthma Begin albuterol 2 puffs every 4 hous as needed for a cough or wheeze For now begin Flovent 110-2 puffs twice a day with a spacer prevent cough or wheeze Continue montelukast 10 mg once a day as above to prevent cough or wheeze  Allergic conjunctivitis Continue Pazeo eye drops one drop in each eye once a day as needed  Reflux Begin famotidine 20 mg twice a day to decrease reflux. This may be the cause of your cough  Follow up in 2 months or sooner if needed  Reducing Pollen Exposure The American Academy of Allergy, Asthma and Immunology suggests the following steps to reduce your exposure to pollen during allergy seasons. 1. Do not hang sheets or clothing out to dry; pollen may collect on these items. 2. Do not mow lawns or spend time around freshly cut grass; mowing stirs up pollen. 3. Keep windows closed at night.  Keep car windows closed while driving. 4. Minimize morning activities outdoors, a time when pollen counts are usually at their highest. 5. Stay indoors as much as possible when pollen counts or humidity is high and on windy days when pollen tends to remain in the air longer. 6. Use air conditioning when possible.  Many air conditioners have filters that trap the pollen spores. 7. Use a HEPA room air filter to remove pollen form the indoor air you breathe.  Control of Mold Allergen Mold and fungi can grow on a variety of surfaces provided certain temperature and moisture conditions exist.  Outdoor molds  grow on plants, decaying vegetation and soil.  The major outdoor mold, Alternaria and Cladosporium, are found in very high numbers during hot and dry conditions.  Generally, a late Summer - Fall peak is seen for common outdoor fungal spores.  Rain will temporarily lower outdoor mold spore count, but counts rise rapidly when the rainy period ends.  The most important indoor molds are Aspergillus and Penicillium.  Dark, humid and poorly ventilated basements are ideal sites for mold growth.  The next most common sites of mold growth are the bathroom and the kitchen.  Outdoor [employer] 8. Use air conditioning and keep windows closed 9. Avoid exposure to decaying vegetation. 10. Avoid leaf raking. 11. Avoid grain handling. 12. Consider wearing a face mask if working in moldy areas.  Indoor Mold Control 1. Maintain humidity below 50%. 2. Clean washable surfaces with 5% bleach solution. 3. Remove sources e.g. Contaminated carpets.  Control of Cockroach Allergen  Cockroach allergen has been identified as an important cause of acute attacks of asthma, especially in urban settings.  There are fifty-five species of cockroach that exist in the [place], however only three, the [place], [place] species produce allergen that can affect patients with Asthma.  Allergens can be obtained from fecal particles, egg casings and secretions from cockroaches.    1. Remove food sources. 2. Reduce access to water. 3. Seal access and entry points. 4. Spray runways with 0.5-1% Diazinon or Chlorpyrifos 5. Blow boric acid power  under stoves and refrigerator. 6. Place bait stations (hydramethylnon) at feeding sites.   Control of Dust Mite Allergen Dust mites play a major role in allergic asthma and rhinitis. They occur in environments with high humidity wherever human skin is found. Dust mites absorb humidity from the atmosphere (ie, they do not drink) and feed on organic matter (including shed  human and animal skin). Dust mites are a microscopic type of insect that you cannot see with the naked eye. High levels of dust mites have been detected from mattresses, pillows, carpets, upholstered furniture, bed covers, clothes, soft toys and any woven material. The principal allergen of the dust mite is found in its feces. A gram of dust may contain 1,000 mites and 250,000 fecal particles. Mite antigen is easily measured in the air during house cleaning activities. Dust mites do not bite and do not cause harm to humans, other than by triggering allergies/asthma.  Ways to decrease your exposure to dust mites in your home:  1. Encase mattresses, box springs and pillows with a mite-impermeable barrier or cover 2. Wash sheets, blankets and drapes weekly in hot water (130 F) with detergent and dry them in a dryer on the hot setting. 3. Have the room cleaned frequently with a vacuum cleaner and a damp dust-mop. For carpeting or rugs, vacuuming with a vacuum cleaner equipped with a high-efficiency particulate air (HEPA) filter. The dust mite allergic individual should not be in a room which is being cleaned and should wait 1 hour after cleaning before going into the room. 4. Do not sleep on upholstered furniture (eg, couches). 5. If possible removing carpeting, upholstered furniture and drapery from the home is ideal. Horizontal blinds should be eliminated in the rooms where the person spends the most time (bedroom, study, television room). Washable vinyl, roller-type shades are optimal. 6. Remove all non-washable stuffed toys from the bedroom. Wash stuffed toys weekly like sheets and blankets above. 7. Reduce indoor humidity to less than 50%. Inexpensive humidity monitors can be purchased at most hardware stores. Do not use a humidifier as can make the problem worse and are not recommended.

## 2019-10-21 ENCOUNTER — Telehealth: Payer: Self-pay | Admitting: *Deleted

## 2019-10-21 MED ORDER — ALBUTEROL SULFATE HFA 108 (90 BASE) MCG/ACT IN AERS: 2.0000 | INHALATION_SPRAY | RESPIRATORY_TRACT | 2 refills | Status: AC | PRN

## 2019-10-21 NOTE — Telephone Encounter (Signed)
Received pa request for albuterol hfa. Sent in proair hfa as replacement to check coverage.

## 2019-11-09 ENCOUNTER — Other Ambulatory Visit: Payer: Self-pay | Admitting: Family Medicine

## 2022-08-07 ENCOUNTER — Emergency Department (HOSPITAL_BASED_OUTPATIENT_CLINIC_OR_DEPARTMENT_OTHER): Payer: BC Managed Care – PPO

## 2022-08-07 ENCOUNTER — Other Ambulatory Visit: Payer: Self-pay

## 2022-08-07 ENCOUNTER — Emergency Department (HOSPITAL_BASED_OUTPATIENT_CLINIC_OR_DEPARTMENT_OTHER)
Admission: EM | Admit: 2022-08-07 | Discharge: 2022-08-07 | Disposition: A | Discharge status: Home / Self Care | Payer: BC Managed Care – PPO | Attending: Emergency Medicine | Admitting: Emergency Medicine

## 2022-08-07 ENCOUNTER — Encounter (HOSPITAL_BASED_OUTPATIENT_CLINIC_OR_DEPARTMENT_OTHER): Payer: Self-pay | Admitting: Emergency Medicine

## 2022-08-07 DIAGNOSIS — K219 Gastro-esophageal reflux disease without esophagitis: Secondary | ICD-10-CM | POA: Insufficient documentation

## 2022-08-07 DIAGNOSIS — R079 Chest pain, unspecified: Secondary | ICD-10-CM

## 2022-08-07 DIAGNOSIS — R072 Precordial pain: Secondary | ICD-10-CM | POA: Diagnosis present

## 2022-08-07 LAB — CBC
HCT: 44.8 % (ref 36.0–46.0)
Hemoglobin: 15.2 g/dL — ABNORMAL HIGH (ref 12.0–15.0)
MCH: 29.7 pg (ref 26.0–34.0)
MCHC: 33.9 g/dL (ref 30.0–36.0)
MCV: 87.7 fL (ref 80.0–100.0)
Platelets: 300 10*3/uL (ref 150–400)
RBC: 5.11 MIL/uL (ref 3.87–5.11)
RDW: 12.6 % (ref 11.5–15.5)
WBC: 8.5 10*3/uL (ref 4.0–10.5)
nRBC: 0 % (ref 0.0–0.2)

## 2022-08-07 LAB — TROPONIN I (HIGH SENSITIVITY)
Troponin I (High Sensitivity): <2 ng/L (ref <18)
Troponin I (High Sensitivity): <2 ng/L (ref <18)

## 2022-08-07 LAB — BASIC METABOLIC PANEL
Anion gap: 9 (ref 5–15)
BUN: 13 mg/dL (ref 6–20)
CO2: 29 mmol/L (ref 22–32)
Calcium: 9.5 mg/dL (ref 8.9–10.3)
Chloride: 99 mmol/L (ref 98–111)
Creatinine, Ser: 0.78 mg/dL (ref 0.44–1.00)
GFR, Estimated: >60 mL/min (ref >60)
Glucose, Bld: 99 mg/dL (ref 70–99)
Potassium: 3.8 mmol/L (ref 3.5–5.1)
Sodium: 137 mmol/L (ref 135–145)

## 2022-08-07 LAB — PREGNANCY, URINE: Preg Test, Ur: NEGATIVE

## 2022-08-07 LAB — D-DIMER, QUANTITATIVE: D-Dimer, Quant: <0.27 ug/mL-FEU (ref 0.00–0.50)

## 2022-08-07 MED ORDER — ALUM & MAG HYDROXIDE-SIMETH 200-200-20 MG/5ML PO SUSP
30.0000 mL | Freq: Once | ORAL | Status: AC
  Administered 2022-08-07: 30 mL via ORAL
  Filled 2022-08-07: qty 30

## 2022-08-07 NOTE — ED Provider Notes (Signed)
[facility] EMERGENCY DEPARTMENT Provider Note   CSN: [id number] Arrival date & time: 08/07/22  1236     History  Chief Complaint  Patient presents with   Chest Pain    Patient was interviewed with [place] video interpreter.  Interpreter [phone].  Connie Horne is a 51 y.o. female with a history of hypercholesterolemia, seasonal allergies, cholecystectomy who presents to the emergency department for a day of intermittent dull substernal chest pain, worse with breathing and walking.  Started when she was doing some light housework.  It is nonradiating.  She does not feel it currently while lying in bed, but she feels like it would hurt if she got up to walk to the car.  The only pain she has had that is similar to this is acid reflux, but that does not usually cause her chest to hurt.  She does not think she takes any hormone therapy.  She denies cough, shortness of breath, abdominal pain, nausea, vomiting, diarrhea, fevers.  Medical history: Hypercholesterolemia Seasonal allergies  Surgical history: Cholecystectomy 1 year ago  Family history: No known history of heart disease or bleeding/clotting disorders.  Social history: Lives in [place] with husband and daughter.  Does not use tobacco, alcohol, drugs.   Chest Pain      Home Medications Prior to Admission medications   Medication Sig Start Date End Date Taking? Authorizing Provider  albuterol (PROAIR HFA) 108 (90 Base) MCG/ACT inhaler Inhale 2 puffs into the lungs every 4 (four) hours as needed for wheezing or shortness of breath. 10/21/19   Hetty Blend, FNP  albuterol (PROVENTIL HFA) 108 (90 Base) MCG/ACT inhaler Inhale 2 puffs into the lungs every 4 (four) hours as needed for wheezing or shortness of breath. 10/20/19   Hetty Blend, FNP  Azelastine HCl 0.15 % SOLN 2 sprays per nostril 2 times daily as needed Patient not taking: Reported on 02/02/2019 08/06/18   Bobbitt, Heywood Iles, MD  benzonatate  (TESSALON) 100 MG capsule Take 2 capsules (200 mg total) by mouth every 8 (eight) hours. Patient not taking: Reported on 02/02/2019 05/24/18   Dietrich Pates, PA-C  budesonide (RHINOCORT AQUA) 32 MCG/ACT nasal spray Place 1 spray into both nostrils daily. 10/20/19   Hetty Blend, FNP  Carbinoxamine Maleate 4 MG TABS Every 8 hours if needed Patient not taking: Reported on 10/20/2019 08/06/18   Bobbitt, Heywood Iles, MD  cetirizine (ZYRTEC) 10 MG tablet Take 1 tablet (10 mg total) by mouth daily. 10/20/19   Ambs, Norvel Richards, FNP  EQ BUDESONIDE NASAL NA Place 1 spray into the nose at bedtime.     [provider]  famotidine (PEPCID) 20 MG tablet Take 1 tablet (20 mg total) by mouth 2 (two) times daily. 10/20/19   Hetty Blend, FNP  fenofibrate (TRICOR) 145 MG tablet TK 1 T PO D 05/20/18   [provider]  fluticasone (FLONASE) 50 MCG/ACT nasal spray 1 spray in each nostril once a day 02/02/19   Ambs, Norvel Richards, FNP  fluticasone (VERAMYST) 27.5 MCG/SPRAY nasal spray Place 1 spray into the nose daily.    [provider]  lisinopril-hydrochlorothiazide (PRINZIDE,ZESTORETIC) 20-25 MG tablet TK 1 T PO D 05/01/18   [provider]  loratadine (CLARITIN) 10 MG tablet Take 1 tablet (10 mg total) by mouth daily. Patient not taking: Reported on 02/02/2019 05/24/18   Dietrich Pates, PA-C  montelukast (SINGULAIR) 10 MG tablet TAKE 1 TABLET(10 MG) BY MOUTH AT BEDTIME 11/09/19  Hetty Blend, FNP  Olopatadine HCl (PAZEO) 0.7 % SOLN Place 1 drop into both eyes 1 day or 1 dose. Patient not taking: Reported on 02/02/2019 08/06/18   Bobbitt, Heywood Iles, MD  Olopatadine HCl (PAZEO) 0.7 % SOLN Place 1 drop into both eyes daily as needed. 10/20/19   Hetty Blend, FNP      Allergies    Patient has no known allergies.    Review of Systems   Review of Systems  Cardiovascular:  Positive for chest pain.  All other systems reviewed and are negative.   Physical Exam Updated Vital Signs BP 110/76   Pulse 66    Temp 98.2 F (36.8 C) (Oral)   Resp 15   Ht 5\' 6"  (1.676 m)   Wt 66.2 kg   SpO2 100%   BMI 23.57 kg/m  Physical Exam Vitals and nursing note reviewed.  Constitutional:      General: She is not in acute distress.    Appearance: She is well-developed.  HENT:     Head: Normocephalic and atraumatic.  Eyes:     Conjunctiva/sclera: Conjunctivae normal.  Cardiovascular:     Rate and Rhythm: Normal rate and regular rhythm.     Heart sounds: No murmur heard. Pulmonary:     Effort: Pulmonary effort is normal. No respiratory distress.     Breath sounds: Normal breath sounds.  Abdominal:     Palpations: Abdomen is soft.     Tenderness: There is no abdominal tenderness.  Musculoskeletal:        General: No swelling.     Cervical back: Neck supple.  Skin:    General: Skin is warm and dry.     Capillary Refill: Capillary refill takes less than 2 seconds.  Neurological:     Mental Status: She is alert.  Psychiatric:        Mood and Affect: Mood normal.     ED Results / Procedures / Treatments   Labs (all labs ordered are listed, but only abnormal results are displayed) Labs Reviewed  CBC - Abnormal; Notable for the following components:      Result Value   Hemoglobin 15.2 (*)    All other components within normal limits  BASIC METABOLIC PANEL  PREGNANCY, URINE  D-DIMER, QUANTITATIVE  TROPONIN I (HIGH SENSITIVITY)  TROPONIN I (HIGH SENSITIVITY)    EKG EKG Interpretation  Date/Time:  Tuesday August 07 2022 13:02:11 EDT Ventricular Rate:  83 PR Interval:  160 QRS Duration: 84 QT Interval:  410 QTC Calculation: 481 R Axis:   77 Text Interpretation: Normal sinus rhythm Prolonged QT Abnormal ECG No previous ECGs available no prior ECG for comparison. No STEMI Confirmed by [id number] (Theda Belfast) on 08/07/2022 4:35:01 PM  Radiology DG Chest 2 View  Result Date: 08/07/2022 CLINICAL DATA:  Provided history: Chest pain. Additional history provided: Patient reports burning  chest pain with shortness of breath. Hypertension. EXAM: CHEST - 2 VIEW COMPARISON:  Prior chest radiograph 05/24/2018. FINDINGS: Heart size within normal limits. No appreciable airspace consolidation or pulmonary edema. No evidence of pleural effusion or pneumothorax. No acute bony abnormality identified. Surgical clips within the right upper quadrant of the abdomen. IMPRESSION: No evidence of active cardiopulmonary disease. Electronically Signed   By: 07/24/2018 D.O.   On: 08/07/2022 13:17    Procedures Procedures    Medications Ordered in ED Medications  alum & mag hydroxide-simeth (MAALOX/MYLANTA) 200-200-20 MG/5ML suspension 30 mL (30 mLs Oral Given 08/07/22 1705)  ED Course/ Medical Decision Making/ A&P Clinical Course as of 08/07/22 2346  Tue Aug 07, 2022  1844 Troponin I (High Sensitivity): <2 [MM]  1844 D-Dimer, Quant: <0.27 [MM]  1844 Repeat troponin negative.  D-dimer negative. [MM]    Clinical Course User Index [MM] Marrianne Mood, MD                           Medical Decision Making Amount and/or Complexity of Data Reviewed Labs: ordered. Decision-making details documented in ED Course. Radiology: ordered.  Risk OTC drugs.   This patient is a 51 year old female with history of hypercholesterolemia and cholecystectomy who presents to the ED for a day of intermittent chest pain.  Pleuritic, worse with mild exertion.  Other than hypercholesterolemia no risk factors for heart disease.  No risk factors for DVT/PE.  On chart review it looks like she was seen at [facility] for this problem.  She was noted to be tachycardic to 101 there.  Per their history the patient's chest pain was associated with some abdominal pain and shortness of breath, however patient denies these associated symptoms now.  Physical exam is reassuring.  Vital signs within normal limits.  Abdomen is soft and nontender.  Overall well-appearing.  Laboratory work-up thus far within normal limits.  Chest  x-ray without findings to explain patient's current clinical syndrome.  EKG shows no signs of acute ischemia.  Patient's history is concerning for cardiac etiology of her pain.  However she also reports that it feels similar in some respects to previous episodes of acid reflux.  I have a low index of suspicion for pulmonary embolism.  I do not suspect an abdominal etiology like sequelae of cholecystectomy or pancreatitis.  I will order a D-dimer to rule out pulmonary embolism.  I will order repeat troponin.  I will administer GI cocktail to see if this helps the patient's symptoms.   Co morbidities that complicate the patient evaluation  Hypercholesterolemia   Social Determinants of Health:  Lives in [place] with husband and daughter. [place] speaking   Additional history obtained:  Additional history and/or information obtained from chart review External records from outside source obtained and reviewed including chart from prior medical encounters   Lab Tests:  I Ordered (or co-signed), and personally interpreted labs.  The pertinent results include:   Initial troponin less than 2 Repeat troponin within normal limits D-dimer within normal limits CBC and BMP within normal limits   Imaging Studies ordered:  I ordered (or co-signed) imaging studies including chest x-ray I independently visualized and interpreted imaging which showed no acute findings to explain the patient's current clinical syndrome I agree with the radiologist interpretation   Cardiac Monitoring:  The patient was maintained on a cardiac monitor.  The cardiac monitored showed an rhythm of normal sinus rhythm. The patient was also maintained on pulse oximetry. The readings were typically within normal limits.   Medicines ordered and prescription drug management:  I ordered medication including aluminum and magnesium hydroxide-simethicone Reevaluation of the patient after these medicines showed that  the patient improved  Reevaluation:  After the interventions noted above, I reevaluated the patient and found that they have :improved.    Dispostion:  After consideration of the diagnostic results and the patients response to treatment, I feel that the patent would benefit from discharge home and follow-up with primary care physician outside the hospital.          Final Clinical  Impression(s) / ED Diagnoses Final diagnoses:  Chest pain, unspecified type  Gastroesophageal reflux disease, unspecified whether esophagitis present    Rx / DC Orders ED Discharge Orders     None         Marrianne Mood, MD 08/07/22 2347    Tegeler, Canary Brim, MD 08/09/22 1354

## 2022-08-07 NOTE — ED Notes (Signed)
Dtg at bedside who can interpret for pt

## 2022-08-07 NOTE — ED Triage Notes (Signed)
Chest burning pain and shortness of breath since yesterday , no cough , was seen at urgent care today . Adds chest pain only with exertion  or deep inhalation.   Cholecystectomies last year.  Hx GERD. Denies urinary symptoms

## 2022-08-07 NOTE — Discharge Instructions (Addendum)
You were evaluated in the emergency department for chest pain.  The results of your evaluation were reassuring.  I do not think you are having a heart attack, blood clot in the lungs, or other dangerous cause of chest pain.  It is possible that you are having acid reflux that is causing chest pain.  Your pain also could be due to a muscle strain.  It is safe for you to go home.  I recommend following up with a doctor outside of the hospital.  Please return to the emergency department for signs and symptoms of severe illness including, but not limited to: New or worsening chest pain, shortness of breath, cough productive of blood, fevers greater than 100.4 F, confusion, fainting.  B?n ? ???c ?nh gi t?i khoa c?p c?u v ?au ng?c. K?t qu? ?nh gi c?a b?n ? ???c yn tm. Ti khng ngh? r?ng b?n ?ang b? ?au tim, c?c mu ?ng trong ph?i ho?c nguyn nhn nguy hi?m khc gy ?au ng?c. C th? b?n ?ang b? tro ng??c axit gy ?au ng?c. C?n ?au c?a b?n c?ng c th? l do c?ng c?. N l an ton cho b?n ?? v? nh. Ti khuyn b?n nn theo di v?i m?t bc s? bn ngoi b?nh vi?n. Vui lng quay l?i phng c?p c?u n?u c cc d?u hi?u v tri?u ch?ng c?a b?nh nghim tr?ng bao g?m, nh?ng khng gi?i h?n ?: ?au ng?c m?i ho?c tr?m tr?ng h?n, kh th?, ho ra mu, s?t cao h?n 100,4 F, l l?n, ng?t x?u.
